# Patient Record
Sex: Male | Born: 1950 | Race: White | Hispanic: No | Smoking: Current every day smoker
Health system: Southern US, Community
[De-identification: ages and names within clinical notes are randomized; demographics above are authoritative.]

## PROBLEM LIST (undated history)

## (undated) DIAGNOSIS — M1990 Unspecified osteoarthritis, unspecified site: Secondary | ICD-10-CM

## (undated) DIAGNOSIS — I10 Essential (primary) hypertension: Secondary | ICD-10-CM

## (undated) HISTORY — PX: APPENDECTOMY: SHX54

---

## 2015-01-29 ENCOUNTER — Encounter (HOSPITAL_COMMUNITY): Payer: Self-pay | Admitting: *Deleted

## 2015-01-29 ENCOUNTER — Emergency Department (HOSPITAL_COMMUNITY): Payer: Medicare Other

## 2015-01-29 ENCOUNTER — Emergency Department (HOSPITAL_COMMUNITY)
Admission: EM | Admit: 2015-01-29 | Discharge: 2015-01-29 | Disposition: A | Discharge status: Home / Self Care | Payer: Medicare Other | Attending: Emergency Medicine | Admitting: Emergency Medicine

## 2015-01-29 DIAGNOSIS — W19XXXA Unspecified fall, initial encounter: Secondary | ICD-10-CM

## 2015-01-29 DIAGNOSIS — Y9289 Other specified places as the place of occurrence of the external cause: Secondary | ICD-10-CM | POA: Insufficient documentation

## 2015-01-29 DIAGNOSIS — I10 Essential (primary) hypertension: Secondary | ICD-10-CM | POA: Diagnosis not present

## 2015-01-29 DIAGNOSIS — G8929 Other chronic pain: Secondary | ICD-10-CM | POA: Insufficient documentation

## 2015-01-29 DIAGNOSIS — Y998 Other external cause status: Secondary | ICD-10-CM | POA: Diagnosis not present

## 2015-01-29 DIAGNOSIS — Y9389 Activity, other specified: Secondary | ICD-10-CM | POA: Insufficient documentation

## 2015-01-29 DIAGNOSIS — S40011A Contusion of right shoulder, initial encounter: Secondary | ICD-10-CM | POA: Insufficient documentation

## 2015-01-29 DIAGNOSIS — S3992XA Unspecified injury of lower back, initial encounter: Secondary | ICD-10-CM | POA: Diagnosis not present

## 2015-01-29 DIAGNOSIS — S8991XA Unspecified injury of right lower leg, initial encounter: Secondary | ICD-10-CM | POA: Insufficient documentation

## 2015-01-29 DIAGNOSIS — S4991XA Unspecified injury of right shoulder and upper arm, initial encounter: Secondary | ICD-10-CM | POA: Diagnosis present

## 2015-01-29 DIAGNOSIS — Z720 Tobacco use: Secondary | ICD-10-CM | POA: Insufficient documentation

## 2015-01-29 DIAGNOSIS — M62830 Muscle spasm of back: Secondary | ICD-10-CM | POA: Insufficient documentation

## 2015-01-29 DIAGNOSIS — M25561 Pain in right knee: Secondary | ICD-10-CM

## 2015-01-29 DIAGNOSIS — M25461 Effusion, right knee: Secondary | ICD-10-CM | POA: Diagnosis not present

## 2015-01-29 DIAGNOSIS — W010XXA Fall on same level from slipping, tripping and stumbling without subsequent striking against object, initial encounter: Secondary | ICD-10-CM | POA: Insufficient documentation

## 2015-01-29 HISTORY — DX: Unspecified osteoarthritis, unspecified site: M19.90

## 2015-01-29 HISTORY — DX: Essential (primary) hypertension: I10

## 2015-01-29 MED ORDER — HYDROCODONE-ACETAMINOPHEN 5-325 MG PO TABS: 1.0000 | ORAL_TABLET | ORAL | Status: AC | PRN

## 2015-01-29 MED ORDER — CYCLOBENZAPRINE HCL 10 MG PO TABS: 10.0000 mg | ORAL_TABLET | Freq: Two times a day (BID) | ORAL | Status: AC | PRN

## 2015-01-29 MED ORDER — NAPROXEN 500 MG PO TABS: 500.0000 mg | ORAL_TABLET | Freq: Two times a day (BID) | ORAL | Status: AC

## 2015-01-29 NOTE — ED Provider Notes (Signed)
CSN: [id number]     Arrival date & time 01/29/15  1434 History   First MD Initiated Contact with Patient 01/29/15 1623     Chief Complaint  Patient presents with  . Fall     (Consider location/radiation/quality/duration/timing/severity/associated sxs/prior Treatment) Patient is a 64 y.o. male presenting with fall. The history is provided by the patient.  Fall This is a new problem. The current episode started in the past 7 days. The problem has been gradually worsening. The symptoms are aggravated by walking, twisting and bending.   Cody Shields is a 64 y.o. male who presents to the ED with right shoulder, back and knee pain that started 2 days ago after he fell and landed on his right side. He reports slipping in water on a tile floor that caused the fall. Patient states that he has been getting Norco 10/325 mg for chronic knee pain but he stopped them 5 months ago because he didn't want to continue to take pain medication. He is on disability for his knee problems. Patient lives in [place], [place]. He has been visiting in [place] for the past week.   Past Medical History  Diagnosis Date  . Hypertension   . Arthritis     knees   Past Surgical History  Procedure Laterality Date  . Appendectomy     History reviewed. No pertinent family history. History  Substance Use Topics  . Smoking status: Current Every Day Smoker -- 1.00 packs/day    Types: Cigarettes  . Smokeless tobacco: Not on file  . Alcohol Use: Yes     Comment: socially    Review of Systems  Musculoskeletal:       Right shoulder, right knee and right lower back pain.  all other systems negative    Allergies  Review of patient's allergies indicates no known allergies.  Home Medications   Prior to Admission medications   Medication Sig Start Date End Date Taking? Authorizing Provider  cyclobenzaprine (FLEXERIL) 10 MG tablet Take 1 tablet (10 mg total) by mouth 2 (two) times daily as needed for muscle spasms.  01/29/15   Hope Orlene OchM Neese, NP  HYDROcodone-acetaminophen (NORCO/VICODIN) 5-325 MG per tablet Take 1 tablet by mouth every 4 (four) hours as needed. 01/29/15   Hope Orlene OchM Neese, NP  naproxen (NAPROSYN) 500 MG tablet Take 1 tablet (500 mg total) by mouth 2 (two) times daily. 01/29/15   Hope Orlene OchM Neese, NP   BP 160/92 mmHg  Pulse 68  Temp(Src) 98.5 F (36.9 C) (Oral)  Resp 18  Ht 5\' 9"  (1.753 m)  Wt 240 lb (108.863 kg)  BMI 35.43 kg/m2  SpO2 98% Physical Exam  Constitutional: He is oriented to person, place, and time. He appears well-developed and well-nourished. No distress.  HENT:  Head: Normocephalic and atraumatic.  Eyes: EOM are normal. Pupils are equal, round, and reactive to light.  Neck: Normal range of motion. Neck supple.  Cardiovascular: Normal rate and regular rhythm.   Pulmonary/Chest: Effort normal. No respiratory distress. He has no wheezes. He has no rales.  Abdominal: Soft. Bowel sounds are normal. There is no tenderness.  Musculoskeletal: Normal range of motion. He exhibits no edema.       Lumbar back: He exhibits tenderness and spasm. He exhibits normal range of motion, no deformity and normal pulse.       Back:  Radial pulses 2+ bilateral, equal grips, good touch sensation. Pain and spasm right lower back into right buttock. Pedal pulses 2+ bilateral, equal  strength, pedal pulses 2+ bilateral.   Neurological: He is alert and oriented to person, place, and time. He has normal strength. No cranial nerve deficit or sensory deficit. Gait normal.  Reflex Scores:      Bicep reflexes are 2+ on the right side and 2+ on the left side.      Brachioradialis reflexes are 2+ on the right side and 2+ on the left side.      Patellar reflexes are 2+ on the right side and 2+ on the left side.      Achilles reflexes are 2+ on the right side and 2+ on the left side. Steady gait without foot drag.   Skin: Skin is warm and dry.  Psychiatric: He has a normal mood and affect. His behavior is normal.   Nursing note and vitals reviewed.   ED Course  Procedures (including critical care time) Dr. Estell Harpin in to see the patient.   Labs Review Labs Reviewed - No data to display  Imaging Review Dg Shoulder Right  01/29/2015   CLINICAL DATA:  64 year old male status post fall and right shoulder pain.  EXAM: RIGHT SHOULDER - 2+ VIEW  COMPARISON:  None.  FINDINGS: There is no fracture or dislocation. There is mild degenerative changes of the acromioclavicular joint. The soft tissues are unremarkable.  IMPRESSION: No acute fracture or dislocation.   Electronically Signed   By: Elgie Collard M.D.   On: 01/29/2015 17:54   Dg Knee Complete 4 Views Right  01/29/2015   CLINICAL DATA:  Right knee pain following fall 2 days ago, initial encounter  EXAM: RIGHT KNEE - COMPLETE 4+ VIEW  COMPARISON:  None.  FINDINGS: No acute fracture or dislocation is noted. Medial joint space narrowing is noted with mild osteophytic change. Mild joint effusion is seen. No other soft tissue abnormality is noted.  IMPRESSION: Mild joint effusion without acute bony abnormality. Mild degenerative changes are seen.   Electronically Signed   By: Alcide Clever M.D.   On: 01/29/2015 17:52   I discussed with the patient x-ray results. Discussed medications of Hydrocodone, Flexeril and Naprosyn. Patient upset and states that he has been on Perc 10's for years and what I am giving him won't help and that he needs at least 30 tablets. I discussed with the patient that I am giving him enough medication until he can get home and follow up with his PCP office that prescribes his chronic pain medication. Patient is upset and request to see Dr. Estell Harpin again.  Dr. Estell Harpin notified.   Patient then came out of the room and states that he does not want a knee immobilizer and does not want an arm sling and does not want to wait on the doctor. He just wants to get out of here.   MDM  64 y.o. male with hx of chronic pain and hx of fall 2 days ago.  Stable for d/c without focal neuro deficits.   Final diagnoses:  Contusion of right shoulder, initial encounter  Knee effusion, right  Fall, initial encounter  Muscle spasm of back       [facility], NP 01/29/15 1826  Bethann Berkshire, MD 02/02/15 1353

## 2015-01-29 NOTE — ED Notes (Signed)
Slipped in water on tile 2 days ago landing on R shoulder and twisting back.  Having R sided back spasms, R shoulder and knee pain.

## 2015-01-29 NOTE — ED Notes (Signed)
Discharge instructions given to pt - Verbalized dis-satisfaction with hospital visit - Stated that nothing had been done for him -- pt had been verbalizing that he need Lortab 10mg  /325 .Marland Kitchen. Displeased with scripts provided > Ambulated off unit by himself - no limping or problems with ambulation observed at this time. Pt refused knee immobilizer

## 2015-01-29 NOTE — ED Notes (Signed)
Pt refused knee immobilizer and sling

## 2015-01-29 NOTE — Discharge Instructions (Signed)
Take the medication as directed. Follow up with Dr. Romeo AppleHarrison if your pain persist. Do not drive while taking the pain medicine or muscle relaxant because they will make you sleepy.

## 2016-02-11 IMAGING — DX DG SHOULDER 2+V*R*
3 series · 3 of 3 positions shown · non-contrast
Comparison: None.

CLINICAL DATA: 64-year-old male status post fall and right shoulder
pain.

EXAM:
RIGHT SHOULDER - 2+ VIEW

[shoulder grashey]
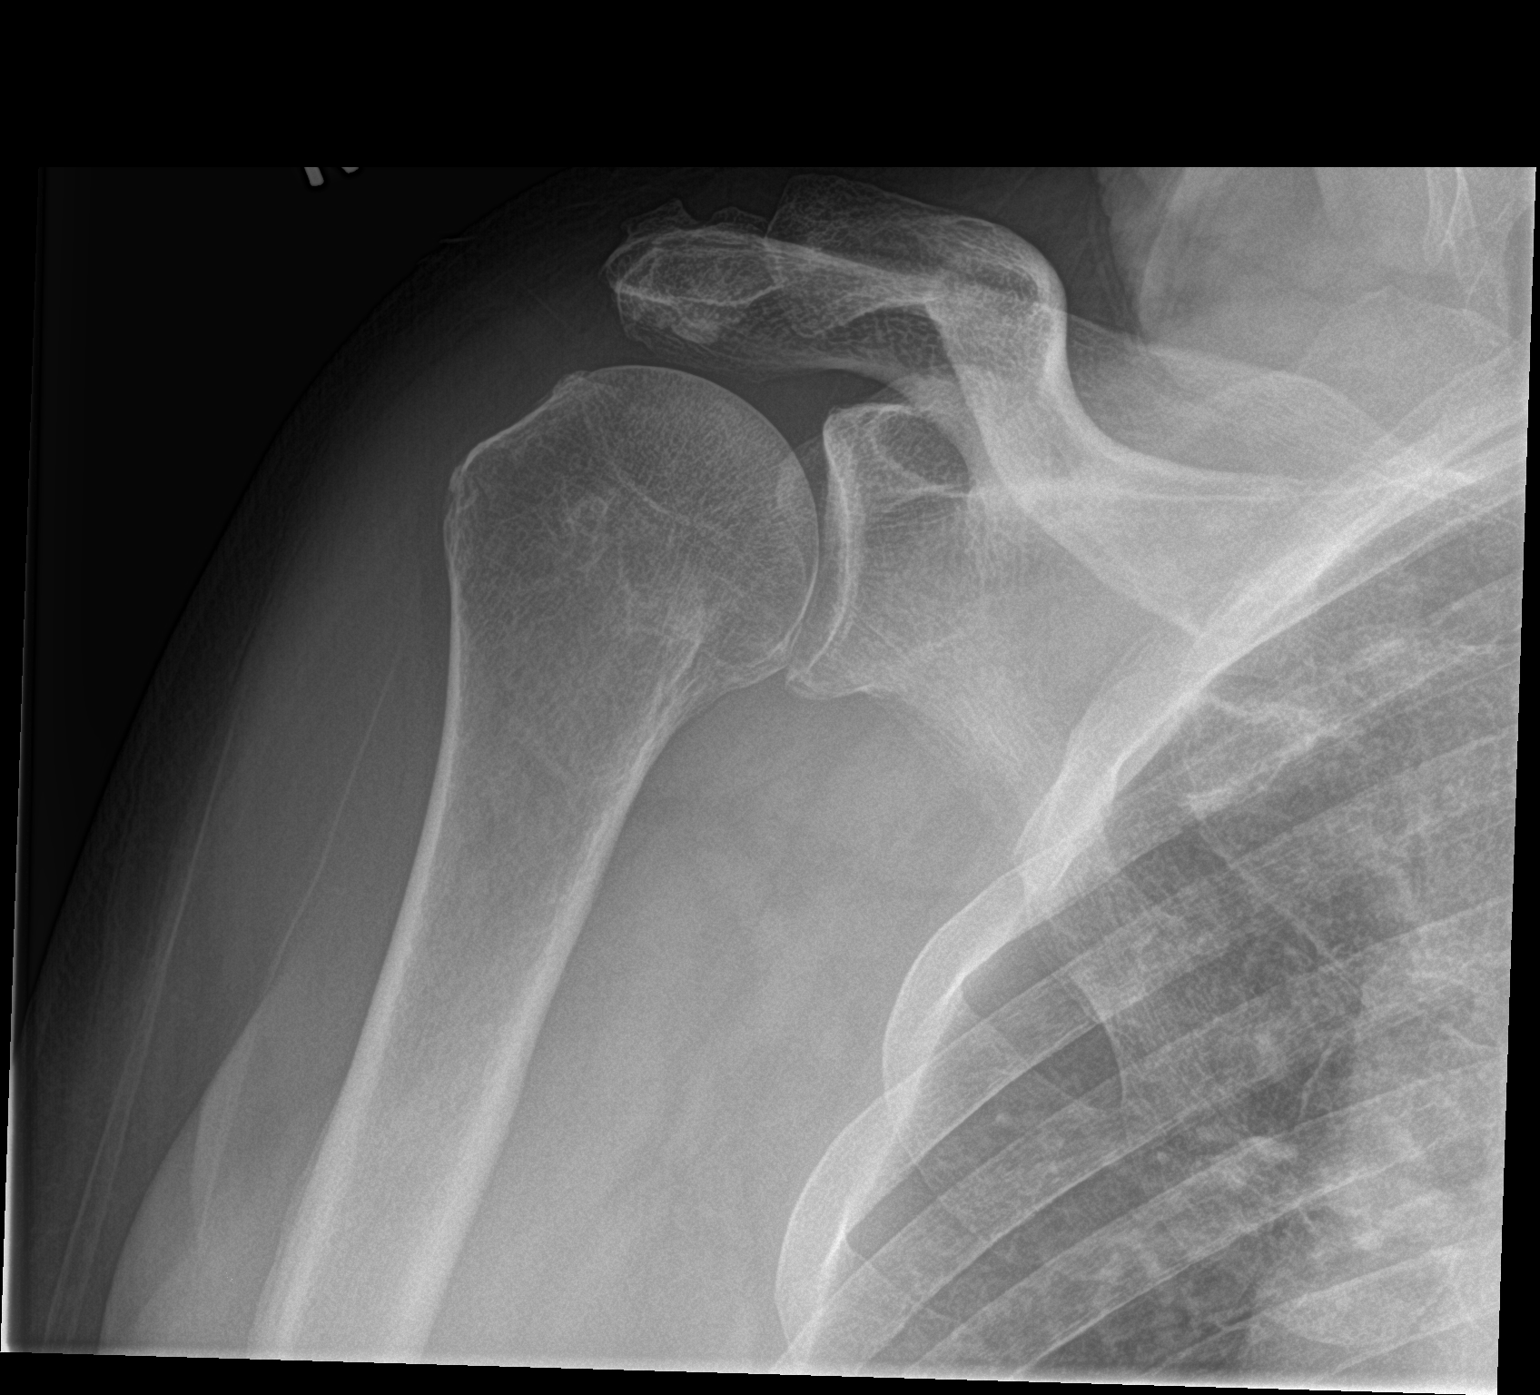

[shoulder y view]
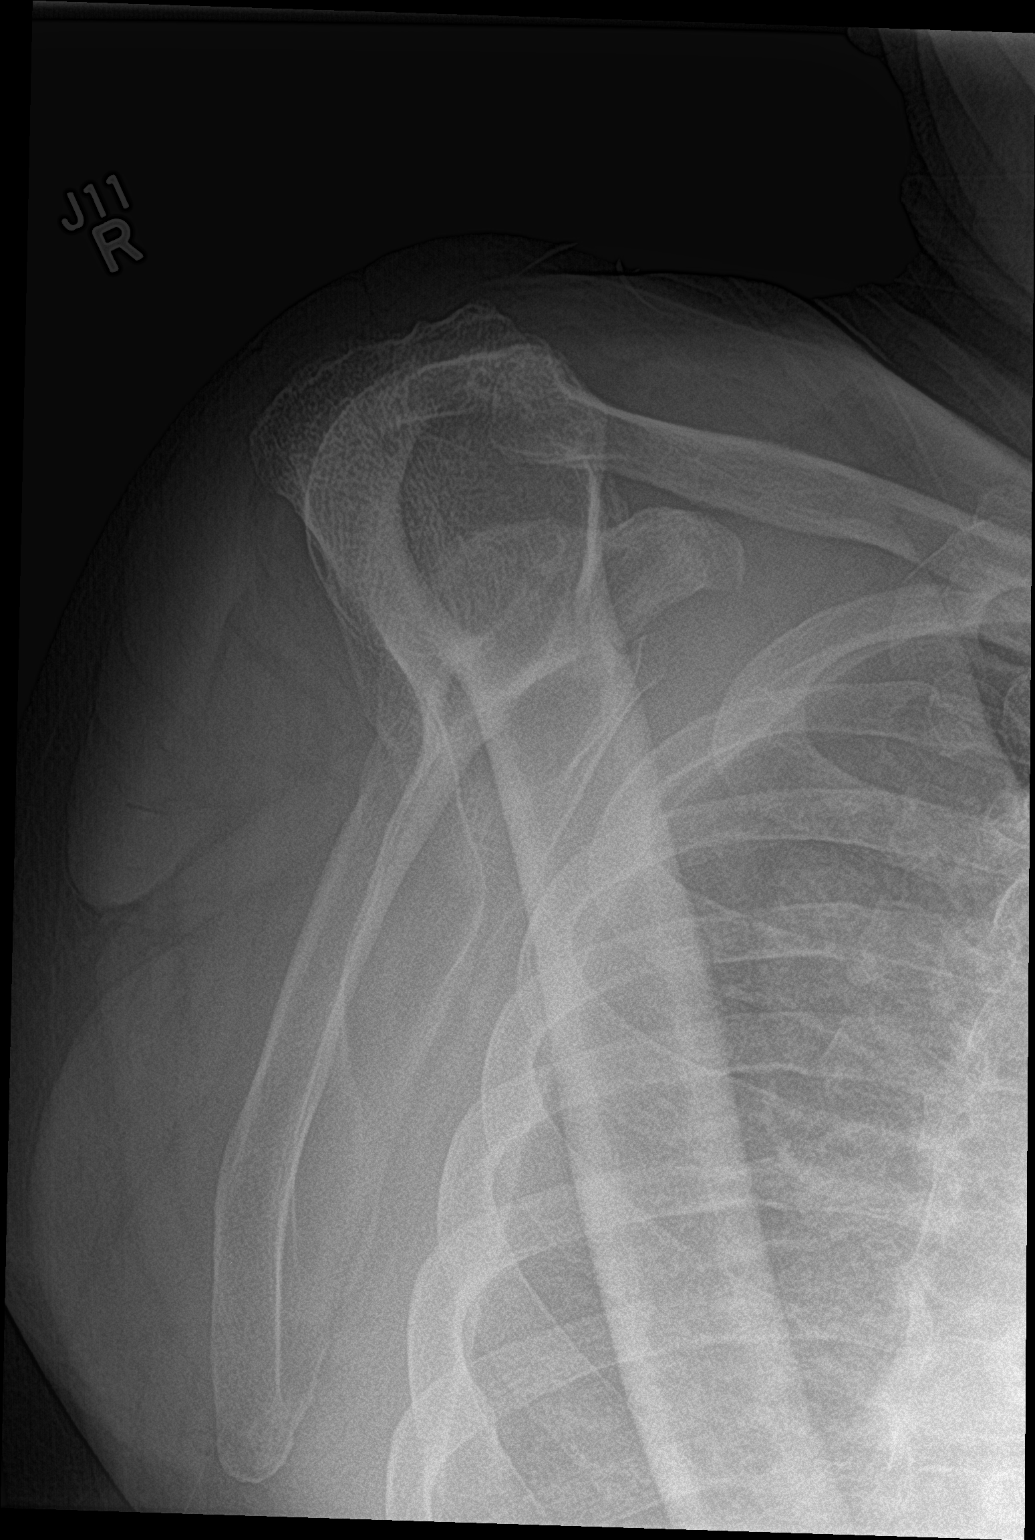

[shoulder axillary]
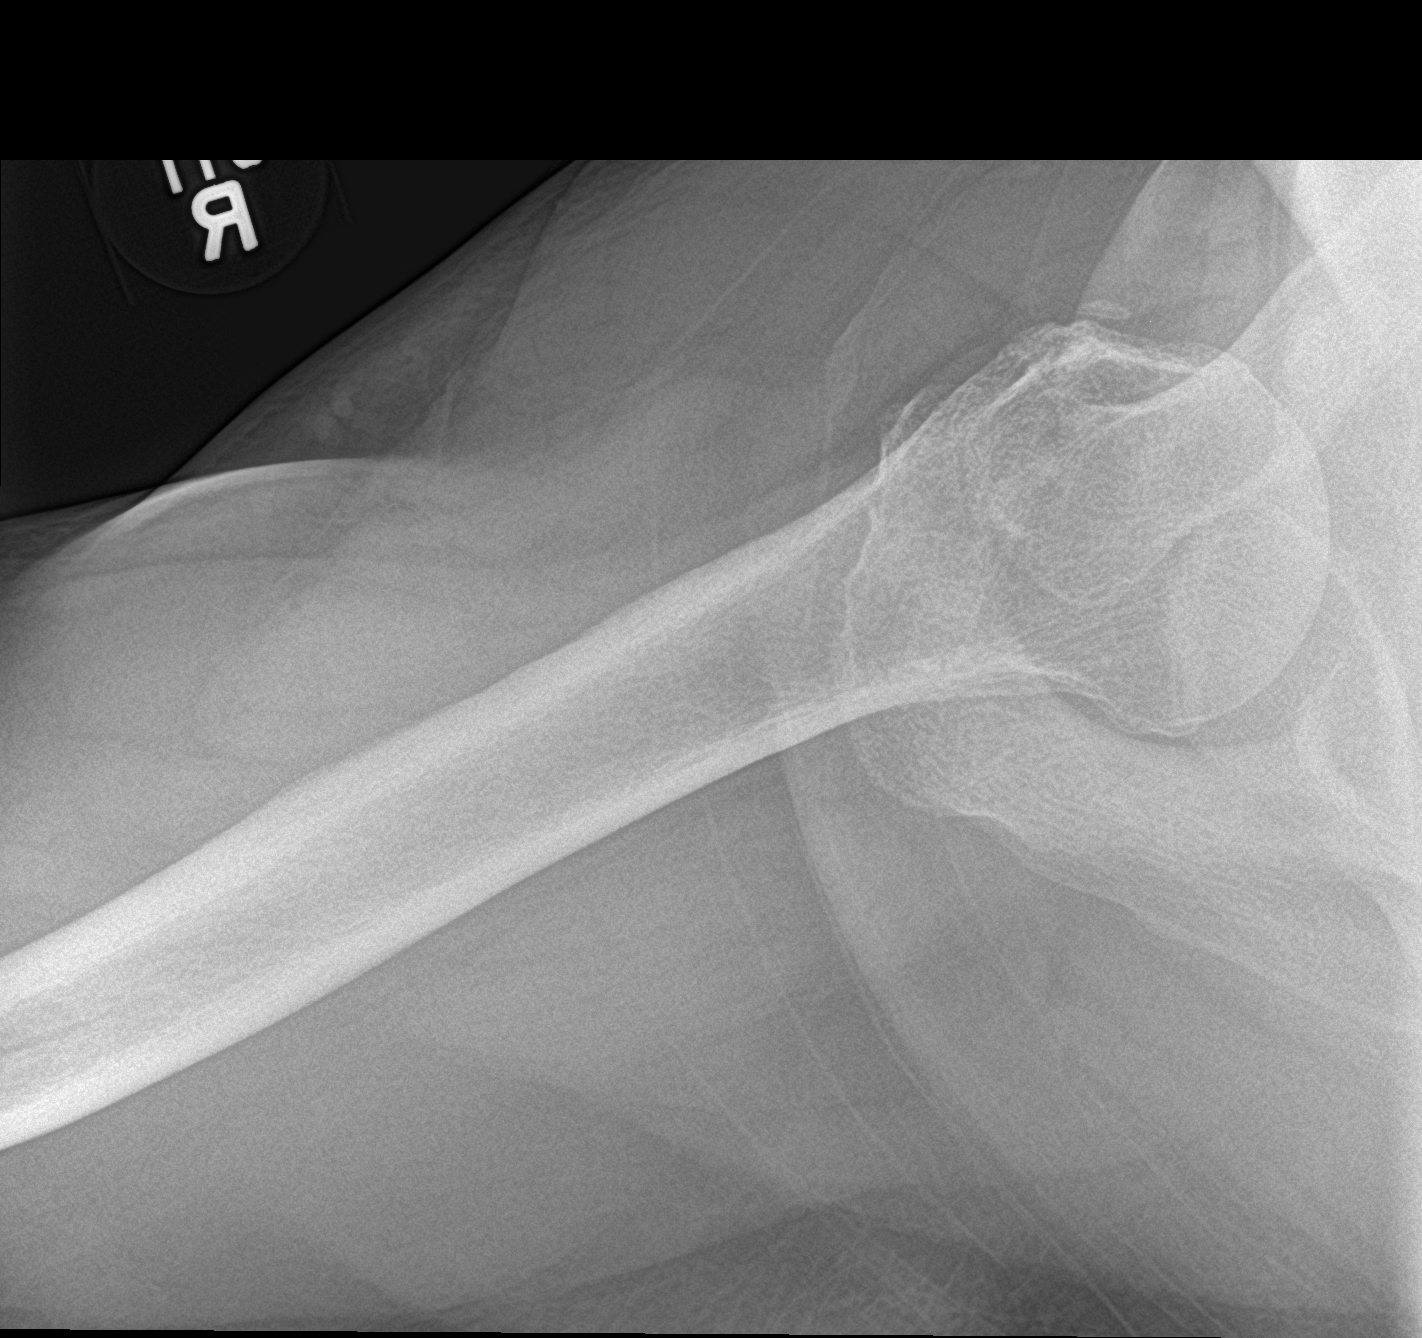

[3 of 3 positions shown; findings below may reference images not displayed]

FINDINGS: There is no fracture or dislocation. There is mild degenerative
changes of the acromioclavicular joint. The soft tissues are
unremarkable.
IMPRESSION: No acute fracture or dislocation.
